# Patient Record
Sex: Female | Born: 1995 | Race: White | Hispanic: No | Marital: Single | Smoking: Former smoker
Health system: Southern US, Community
[De-identification: ages and names within clinical notes are randomized; demographics above are authoritative.]

## PROBLEM LIST (undated history)

## (undated) DIAGNOSIS — Z91010 Allergy to peanuts: Secondary | ICD-10-CM

---
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
## 2014-08-12 ENCOUNTER — Emergency Department (HOSPITAL_BASED_OUTPATIENT_CLINIC_OR_DEPARTMENT_OTHER)
Admission: EM | Admit: 2014-08-12 | Discharge: 2014-08-12 | Disposition: A | Discharge status: Home / Self Care | Payer: Medicaid Other | Attending: Emergency Medicine | Admitting: Emergency Medicine

## 2014-08-12 ENCOUNTER — Encounter (HOSPITAL_BASED_OUTPATIENT_CLINIC_OR_DEPARTMENT_OTHER): Payer: Self-pay | Admitting: *Deleted

## 2014-08-12 DIAGNOSIS — S199XXA Unspecified injury of neck, initial encounter: Secondary | ICD-10-CM | POA: Diagnosis present

## 2014-08-12 DIAGNOSIS — Y999 Unspecified external cause status: Secondary | ICD-10-CM | POA: Insufficient documentation

## 2014-08-12 DIAGNOSIS — S161XXA Strain of muscle, fascia and tendon at neck level, initial encounter: Secondary | ICD-10-CM | POA: Diagnosis not present

## 2014-08-12 DIAGNOSIS — Y92410 Unspecified street and highway as the place of occurrence of the external cause: Secondary | ICD-10-CM | POA: Insufficient documentation

## 2014-08-12 DIAGNOSIS — Y9389 Activity, other specified: Secondary | ICD-10-CM | POA: Insufficient documentation

## 2014-08-12 DIAGNOSIS — Z720 Tobacco use: Secondary | ICD-10-CM | POA: Diagnosis not present

## 2014-08-12 MED ORDER — ACETAMINOPHEN 500 MG PO TABS: 1000.0000 mg | ORAL_TABLET | Freq: Once | ORAL | Status: DC
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
## 2015-06-22 ENCOUNTER — Encounter (HOSPITAL_BASED_OUTPATIENT_CLINIC_OR_DEPARTMENT_OTHER): Payer: Self-pay | Admitting: Emergency Medicine

## 2015-06-22 ENCOUNTER — Emergency Department (HOSPITAL_BASED_OUTPATIENT_CLINIC_OR_DEPARTMENT_OTHER): Payer: BLUE CROSS/BLUE SHIELD

## 2015-06-22 ENCOUNTER — Emergency Department (HOSPITAL_BASED_OUTPATIENT_CLINIC_OR_DEPARTMENT_OTHER)
Admission: EM | Admit: 2015-06-22 | Discharge: 2015-06-22 | Disposition: A | Discharge status: Home / Self Care | Payer: BLUE CROSS/BLUE SHIELD | Attending: Emergency Medicine | Admitting: Emergency Medicine

## 2015-06-22 DIAGNOSIS — S8992XA Unspecified injury of left lower leg, initial encounter: Secondary | ICD-10-CM | POA: Insufficient documentation

## 2015-06-22 DIAGNOSIS — S199XXA Unspecified injury of neck, initial encounter: Secondary | ICD-10-CM | POA: Insufficient documentation

## 2015-06-22 DIAGNOSIS — Y998 Other external cause status: Secondary | ICD-10-CM | POA: Insufficient documentation

## 2015-06-22 DIAGNOSIS — Y9389 Activity, other specified: Secondary | ICD-10-CM | POA: Diagnosis not present

## 2015-06-22 DIAGNOSIS — Y92410 Unspecified street and highway as the place of occurrence of the external cause: Secondary | ICD-10-CM | POA: Insufficient documentation

## 2015-06-22 DIAGNOSIS — Z720 Tobacco use: Secondary | ICD-10-CM | POA: Insufficient documentation

## 2015-06-22 DIAGNOSIS — S8991XA Unspecified injury of right lower leg, initial encounter: Secondary | ICD-10-CM | POA: Insufficient documentation

## 2015-06-22 DIAGNOSIS — R52 Pain, unspecified: Secondary | ICD-10-CM

## 2015-06-22 DIAGNOSIS — S99911A Unspecified injury of right ankle, initial encounter: Secondary | ICD-10-CM | POA: Diagnosis not present

## 2015-06-22 DIAGNOSIS — M7918 Myalgia, other site: Secondary | ICD-10-CM

## 2015-06-22 MED ORDER — ACETAMINOPHEN 500 MG PO TABS
1000.0000 mg | ORAL_TABLET | Freq: Once | ORAL | Status: AC
Start: 2015-06-22 — End: 2015-06-22
  Administered 2015-06-22: 1000 mg via ORAL
  Filled 2015-06-22: qty 2

## 2015-06-22 MED ORDER — CYCLOBENZAPRINE HCL 10 MG PO TABS: 10.0000 mg | ORAL_TABLET | Freq: Three times a day (TID) | ORAL | Status: AC | PRN

## 2015-06-22 MED ORDER — CYCLOBENZAPRINE HCL 10 MG PO TABS
10.0000 mg | ORAL_TABLET | Freq: Once | ORAL | Status: AC
  Administered 2015-06-22: 10 mg via ORAL
  Filled 2015-06-22: qty 1

## 2015-06-22 NOTE — ED Notes (Signed)
Pt teaching done re: (1) safety while taking PO meds as prescribed by EDP, (2) utilization of ice for comfort (3) when to follow up with ED or primary MD.

## 2015-06-22 NOTE — Discharge Instructions (Signed)
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
## 2016-05-30 ENCOUNTER — Encounter (HOSPITAL_BASED_OUTPATIENT_CLINIC_OR_DEPARTMENT_OTHER): Payer: Self-pay | Admitting: Emergency Medicine

## 2016-05-30 ENCOUNTER — Emergency Department (HOSPITAL_BASED_OUTPATIENT_CLINIC_OR_DEPARTMENT_OTHER)
Admission: EM | Admit: 2016-05-30 | Discharge: 2016-05-30 | Disposition: A | Discharge status: Home / Self Care | Payer: Medicaid Other | Attending: Emergency Medicine | Admitting: Emergency Medicine

## 2016-05-30 DIAGNOSIS — Z79899 Other long term (current) drug therapy: Secondary | ICD-10-CM | POA: Diagnosis not present

## 2016-05-30 DIAGNOSIS — R21 Rash and other nonspecific skin eruption: Secondary | ICD-10-CM | POA: Diagnosis present

## 2016-05-30 DIAGNOSIS — T7840XA Allergy, unspecified, initial encounter: Secondary | ICD-10-CM | POA: Insufficient documentation

## 2016-05-30 DIAGNOSIS — F17210 Nicotine dependence, cigarettes, uncomplicated: Secondary | ICD-10-CM | POA: Diagnosis not present

## 2016-05-30 MED ORDER — CETIRIZINE HCL 10 MG PO CAPS: 10.0000 mg | ORAL_CAPSULE | Freq: Every day | ORAL | 0 refills | Status: AC

## 2016-05-30 NOTE — ED Provider Notes (Signed)
[facility] Provider Note   CSN: [id number] Arrival date & time: 05/30/16  1308     History   Chief Complaint Chief Complaint  Patient presents with  . Rash    HPI Kathleen Stevenson is a 20 y.o. female.  The history is provided by the patient.  Rash   This is a new problem. The current episode started yesterday. The problem has been gradually worsening. The problem is associated with a new detergent/soap (new facial cleanser). There has been no fever. The rash is present on the face. The pain is at a severity of 0/10. The patient is experiencing no pain. The pain has been constant since onset. Associated symptoms comments: Mild itching and swelling. She has tried nothing for the symptoms. The treatment provided no relief. Risk factors include new environmental exposures.    History reviewed. No pertinent past medical history.  There are no active problems to display for this patient.   History reviewed. No pertinent surgical history.  OB History    No data available       Home Medications    Prior to Admission medications   Medication Sig Start Date End Date Taking? Authorizing Provider  EPINEPHrine 0.3 mg/0.3 mL IJ SOAJ injection Inject into the muscle once.   Yes Historical Provider, MD  Cetirizine HCl 10 MG CAPS Take 1 capsule (10 mg total) by mouth daily. 05/30/16   Kathleen SproutWhitney Kimerly Rowand, MD  cyclobenzaprine (FLEXERIL) 10 MG tablet Take 1 tablet (10 mg total) by mouth 3 (three) times daily as needed for muscle spasms. 06/22/15   Elwin MochaBlair Walden, MD    Family History History reviewed. No pertinent family history.  Social History Social History  Substance Use Topics  . Smoking status: Current Some Day Smoker    Types: Cigarettes  . Smokeless tobacco: Never Used  . Alcohol use Yes     Allergies   Peanut-containing drug products and Ibuprofen   Review of Systems Review of Systems  Skin: Positive for rash.  All other systems reviewed and are  negative.    Physical Exam Updated Vital Signs BP 121/74 (BP Location: Right Arm)   Pulse 95   Temp 99.1 F (37.3 C) (Oral)   Resp 18   Ht 5\' 6"  (1.676 m)   Wt 122 lb (55.3 kg)   LMP 05/16/2016   SpO2 100%   BMI 19.69 kg/m   Physical Exam  Constitutional: She is oriented to person, place, and time. She appears well-developed and well-nourished.  HENT:  Right Ear: Tympanic membrane normal.  Left Ear: Tympanic membrane normal.  No oral or lip swelling.  Mild periorbital swelling.  No rash present  Eyes: EOM are normal. Pupils are equal, round, and reactive to light.  Cardiovascular: Normal rate.   Pulmonary/Chest: Effort normal.  Neurological: She is alert and oriented to person, place, and time.  Skin: Skin is warm and dry.  Psychiatric: She has a normal mood and affect. Her behavior is normal.  Nursing note and vitals reviewed.    ED Treatments / Results  Labs (all labs ordered are listed, but only abnormal results are displayed) Labs Reviewed - No data to display  EKG  EKG Interpretation None       Radiology No results found.  Procedures Procedures (including critical care time)  Medications Ordered in ED Medications - No data to display   Initial Impression / Assessment and Plan / ED Course  I have reviewed the triage vital signs and the nursing  notes.  Pertinent labs & imaging results that were available during my care of the patient were reviewed by me and considered in my medical decision making (see chart for details).  Clinical Course   Pt with allergic reaction to her face from new facial cleanser.  No airway involvement.  Reaction is mild.  To use zyrtec and benadryl and stop product.  Final Clinical Impressions(s) / ED Diagnoses   Final diagnoses:  Allergic reaction, initial encounter    New Prescriptions Discharge Medication List as of 05/30/2016  1:47 PM    START taking these medications   Details  Cetirizine HCl 10 MG CAPS Take 1  capsule (10 mg total) by mouth daily., Starting Sun 05/30/2016, Print         Kathleen Sprout, MD 05/30/16 [phone]

## 2016-05-30 NOTE — ED Triage Notes (Signed)
Patient states that she has a rash to her face and some facial swelling.
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
## 2018-01-11 ENCOUNTER — Emergency Department (HOSPITAL_COMMUNITY)
Admission: EM | Admit: 2018-01-11 | Discharge: 2018-01-12 | Disposition: A | Discharge status: Home / Self Care | Payer: BLUE CROSS/BLUE SHIELD | Attending: Emergency Medicine | Admitting: Emergency Medicine

## 2018-01-11 ENCOUNTER — Other Ambulatory Visit: Payer: Self-pay

## 2018-01-11 ENCOUNTER — Encounter (HOSPITAL_COMMUNITY): Payer: Self-pay | Admitting: *Deleted

## 2018-01-11 DIAGNOSIS — T782XXA Anaphylactic shock, unspecified, initial encounter: Secondary | ICD-10-CM

## 2018-01-11 DIAGNOSIS — Z87891 Personal history of nicotine dependence: Secondary | ICD-10-CM | POA: Insufficient documentation

## 2018-01-11 DIAGNOSIS — Z79899 Other long term (current) drug therapy: Secondary | ICD-10-CM | POA: Insufficient documentation

## 2018-01-11 DIAGNOSIS — Z91010 Allergy to peanuts: Secondary | ICD-10-CM | POA: Insufficient documentation

## 2018-01-11 HISTORY — DX: Allergy to peanuts: Z91.010

## 2018-01-11 MED ORDER — DIPHENHYDRAMINE HCL 25 MG PO CAPS
25.0000 mg | ORAL_CAPSULE | Freq: Once | ORAL | Status: AC
Start: 2018-01-11 — End: 2018-01-11
  Administered 2018-01-11: 25 mg via ORAL
  Filled 2018-01-11: qty 1

## 2018-01-11 MED ORDER — FAMOTIDINE 20 MG PO TABS
20.0000 mg | ORAL_TABLET | Freq: Once | ORAL | Status: AC
  Administered 2018-01-11: 20 mg via ORAL
  Filled 2018-01-11: qty 1

## 2018-01-11 MED ORDER — PREDNISONE 20 MG PO TABS
60.0000 mg | ORAL_TABLET | Freq: Once | ORAL | Status: AC
  Administered 2018-01-11: 60 mg via ORAL
  Filled 2018-01-11: qty 3

## 2018-01-11 MED ORDER — EPINEPHRINE 0.3 MG/0.3ML IJ SOAJ
0.3000 mg | INTRAMUSCULAR | Status: DC | PRN
Start: 2018-01-11 — End: 2018-01-12

## 2018-01-11 NOTE — ED Triage Notes (Signed)
Pt states she went to get ice cream, feels like utensil may have had peanut butter on it, used Epi-pen still feels like mouth has hives in it. No airway involvement

## 2018-01-11 NOTE — ED Provider Notes (Signed)
[facility]-EMERGENCY DEPT Provider Note  CSN: [id number] Arrival date & time: 01/11/18 1945  Chief Complaint(s) Allergic Reaction  HPI Kathleen Stevenson is a 22 y.o. female   The history is provided by the patient.  Allergic Reaction  Presenting symptoms: difficulty breathing and swelling   Severity:  Moderate Duration:  1 hour Prior allergic episodes:  Food/nut allergies Context comment:  Unsure, but thinks ice scream spoon Relieved by:  Epinephrine   Past Medical History Past Medical History:  Diagnosis Date  . Peanut allergy    There are no active problems to display for this patient.  Home Medication(s) Prior to Admission medications   Medication Sig Start Date End Date Taking? Authorizing Provider  Cetirizine HCl 10 MG CAPS Take 1 capsule (10 mg total) by mouth daily. 05/30/16   [name], MD  cyclobenzaprine (FLEXERIL) 10 MG tablet Take 1 tablet (10 mg total) by mouth 3 (three) times daily as needed for muscle spasms. 06/22/15   Elwin Mocha, MD  diphenhydrAMINE (BENADRYL) 25 MG tablet Take 1 tablet (25 mg total) by mouth every 6 (six) hours as needed for up to 5 days for allergies. 01/11/18 01/16/18  Nira Conn, MD  EPINEPHrine 0.3 mg/0.3 mL IJ SOAJ injection Inject 0.3 mLs (0.3 mg total) into the muscle once for 1 dose. 01/12/18 01/12/18  Nira Conn, MD  famotidine (PEPCID) 40 MG tablet Take 1 tablet (40 mg total) by mouth daily for 5 days. 01/11/18 01/16/18  Nira Conn, MD  predniSONE (DELTASONE) 10 MG tablet Take 4 tablets (40 mg total) by mouth daily for 4 days. 01/12/18 01/16/18  Nira Conn, MD                                                                                                                                    Past Surgical History History reviewed. No pertinent surgical history. Family History No family history on file.  Social History Social History   Tobacco Use  . Smoking  status: Former Smoker    Types: Cigarettes  . Smokeless tobacco: Never Used  Substance Use Topics  . Alcohol use: Yes  . Drug use: No   Allergies Peanut-containing drug products; Ibuprofen; and Peanut oil  Review of Systems Review of Systems All other systems are reviewed and are negative for acute change except as noted in the HPI  Physical Exam Vital Signs  I have reviewed the triage vital signs BP 137/75 (BP Location: Left Arm)   Pulse 86   Temp 98.1 F (36.7 C) (Oral)   Resp 18   Ht 5\' 6"  (1.676 m)   Wt 63.5 kg (140 lb)   SpO2 95%   BMI 22.60 kg/m   Physical Exam  Constitutional: She is oriented to person, place, and time. She appears well-developed and well-nourished. No distress.  HENT:  Head: Normocephalic and atraumatic.  Nose: Nose normal.  Mouth/Throat: No  uvula swelling. Posterior oropharyngeal edema (mild with cobblestoning) present.  Eyes: Pupils are equal, round, and reactive to light. Conjunctivae and EOM are normal. Right eye exhibits no discharge. Left eye exhibits no discharge. No scleral icterus.  Neck: Normal range of motion. Neck supple.  Cardiovascular: Normal rate and regular rhythm. Exam reveals no gallop and no friction rub.  No murmur heard. Pulmonary/Chest: Effort normal and breath sounds normal. No stridor. No respiratory distress. She has no rales.  Abdominal: Soft. She exhibits no distension. There is no tenderness.  Musculoskeletal: She exhibits no edema or tenderness.  Neurological: She is alert and oriented to person, place, and time.  Skin: Skin is warm and dry. No rash noted. She is not diaphoretic. No erythema.  Psychiatric: She has a normal mood and affect.  Vitals reviewed.   ED Results and Treatments Labs (all labs ordered are listed, but only abnormal results are displayed) Labs Reviewed - No data to display                                                                                                                         EKG  EKG Interpretation  Date/Time:    Ventricular Rate:    PR Interval:    QRS Duration:   QT Interval:    QTC Calculation:   R Axis:     Text Interpretation:        Radiology No results found. Pertinent labs & imaging results that were available during my care of the patient were reviewed by me and considered in my medical decision making (see chart for details).  Medications Ordered in ED Medications  EPINEPHrine (EPI-PEN) injection 0.3 mg (has no administration in time range)  diphenhydrAMINE (BENADRYL) capsule 25 mg (25 mg Oral Given 01/11/18 2042)  predniSONE (DELTASONE) tablet 60 mg (60 mg Oral Given 01/11/18 2042)  famotidine (PEPCID) tablet 20 mg (20 mg Oral Given 01/11/18 2042)                                                                                                                                    Procedures Procedures  (including critical care time)  Medical Decision Making / ED Course I have reviewed the nursing notes for this encounter and the patient's prior records (if available in EHR or on provided paperwork).    22 y.o. female here with pruritic rash. No known triggers or exposures.  No respiratory, GI, or neurologic symptoms to suggest anaphylaxis at this time. No recent infectious symptoms suggestive of viral urticaria.  Patient has used her epipen prior to arrival resulting in improved oral swelling sensation.  On exam, there is mild posterior oropharynx edema, but no\r airway compromise.   Given Benadryl, H2 blocker, and steroids. Monitored for 5 hrs. Symptoms completely resolved.  Safe for discharge with strict return precautions. Given Rx for H2 blocker and steroids.   Final Clinical Impression(s) / ED Diagnoses Final diagnoses:  Anaphylaxis, initial encounter    Disposition: Discharge  Condition: Good  I have discussed the results, Dx and Tx plan with the patient who expressed understanding and agree(s) with the plan. Discharge  instructions discussed at great length. The patient was given strict return precautions who verbalized understanding of the instructions. No further questions at time of discharge.    ED Discharge Orders        Ordered    predniSONE (DELTASONE) 10 MG tablet  Daily     01/12/18 0001    EPINEPHrine 0.3 mg/0.3 mL IJ SOAJ injection   Once     01/12/18 0001    diphenhydrAMINE (BENADRYL) 25 MG tablet  Every 6 hours PRN     01/12/18 0001    famotidine (PEPCID) 40 MG tablet  Daily     01/12/18 0001       Follow Up: primary care provider  Schedule an appointment as soon as possible for a visit  As needed     This chart was dictated using voice recognition software.  Despite best efforts to proofread,  errors can occur which can change the documentation meaning.   Nira Conn, MD 01/12/18 0001

## 2018-01-12 MED ORDER — EPINEPHRINE 0.3 MG/0.3ML IJ SOAJ: 0.3000 mg | Freq: Once | INTRAMUSCULAR | 1 refills | Status: AC

## 2018-01-12 MED ORDER — DIPHENHYDRAMINE HCL 25 MG PO TABS: 25.0000 mg | ORAL_TABLET | Freq: Four times a day (QID) | ORAL | 0 refills | Status: AC | PRN

## 2018-01-12 MED ORDER — FAMOTIDINE 40 MG PO TABS: 40.0000 mg | ORAL_TABLET | Freq: Every day | ORAL | 0 refills | Status: AC

## 2018-01-12 MED ORDER — PREDNISONE 10 MG PO TABS: 40.0000 mg | ORAL_TABLET | Freq: Every day | ORAL | 0 refills | Status: AC
